# Patient Record
Sex: Female | Born: 1990 | Race: White | Hispanic: No | Marital: Single | Smoking: Never smoker
Health system: Southern US, Community
[De-identification: ages and names within clinical notes are randomized; demographics above are authoritative.]

## PROBLEM LIST (undated history)

## (undated) DIAGNOSIS — L309 Dermatitis, unspecified: Secondary | ICD-10-CM

## (undated) DIAGNOSIS — F419 Anxiety disorder, unspecified: Secondary | ICD-10-CM

## (undated) DIAGNOSIS — J309 Allergic rhinitis, unspecified: Secondary | ICD-10-CM

## (undated) DIAGNOSIS — Z91018 Allergy to other foods: Secondary | ICD-10-CM

## (undated) DIAGNOSIS — F410 Panic disorder [episodic paroxysmal anxiety] without agoraphobia: Secondary | ICD-10-CM

## (undated) DIAGNOSIS — J45909 Unspecified asthma, uncomplicated: Secondary | ICD-10-CM

## (undated) HISTORY — PX: MOUTH SURGERY: SHX715

## (undated) HISTORY — PX: WRIST ARTHROCENTESIS: SUR48

## (undated) HISTORY — PX: WRIST GANGLION EXCISION: SUR520

## (undated) HISTORY — PX: WRIST SURGERY: SHX841

## (undated) HISTORY — PX: NASAL SINUS SURGERY: SHX719

## (undated) HISTORY — DX: Allergy to other foods: Z91.018

## (undated) HISTORY — DX: Allergic rhinitis, unspecified: J30.9

---
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
## 2012-07-07 ENCOUNTER — Emergency Department (HOSPITAL_BASED_OUTPATIENT_CLINIC_OR_DEPARTMENT_OTHER): Payer: BC Managed Care – PPO

## 2012-07-07 ENCOUNTER — Emergency Department (HOSPITAL_BASED_OUTPATIENT_CLINIC_OR_DEPARTMENT_OTHER)
Admission: EM | Admit: 2012-07-07 | Discharge: 2012-07-07 | Disposition: A | Discharge status: Home / Self Care | Payer: BC Managed Care – PPO | Attending: Emergency Medicine | Admitting: Emergency Medicine

## 2012-07-07 ENCOUNTER — Encounter (HOSPITAL_BASED_OUTPATIENT_CLINIC_OR_DEPARTMENT_OTHER): Payer: Self-pay | Admitting: Emergency Medicine

## 2012-07-07 DIAGNOSIS — Z872 Personal history of diseases of the skin and subcutaneous tissue: Secondary | ICD-10-CM | POA: Insufficient documentation

## 2012-07-07 DIAGNOSIS — Z8659 Personal history of other mental and behavioral disorders: Secondary | ICD-10-CM | POA: Insufficient documentation

## 2012-07-07 DIAGNOSIS — N390 Urinary tract infection, site not specified: Secondary | ICD-10-CM | POA: Insufficient documentation

## 2012-07-07 DIAGNOSIS — R259 Unspecified abnormal involuntary movements: Secondary | ICD-10-CM | POA: Insufficient documentation

## 2012-07-07 DIAGNOSIS — R253 Fasciculation: Secondary | ICD-10-CM

## 2012-07-07 DIAGNOSIS — Z3202 Encounter for pregnancy test, result negative: Secondary | ICD-10-CM | POA: Insufficient documentation

## 2012-07-07 DIAGNOSIS — IMO0001 Reserved for inherently not codable concepts without codable children: Secondary | ICD-10-CM | POA: Insufficient documentation

## 2012-07-07 DIAGNOSIS — J45909 Unspecified asthma, uncomplicated: Secondary | ICD-10-CM | POA: Insufficient documentation

## 2012-07-07 DIAGNOSIS — Z79899 Other long term (current) drug therapy: Secondary | ICD-10-CM | POA: Insufficient documentation

## 2012-07-07 HISTORY — DX: Dermatitis, unspecified: L30.9

## 2012-07-07 HISTORY — DX: Unspecified asthma, uncomplicated: J45.909

## 2012-07-07 HISTORY — DX: Panic disorder (episodic paroxysmal anxiety): F41.0

## 2012-07-07 HISTORY — DX: Anxiety disorder, unspecified: F41.9

## 2012-07-07 LAB — CBC WITH DIFFERENTIAL/PLATELET
Basophils Absolute: 0 10*3/uL (ref 0.0–0.1)
Eosinophils Absolute: 0.3 10*3/uL (ref 0.0–0.7)
Eosinophils Relative: 4 % (ref 0–5)
HCT: 38.4 % (ref 36.0–46.0)
Lymphs Abs: 2.1 10*3/uL (ref 0.7–4.0)
MCH: 30.9 pg (ref 26.0–34.0)
MCV: 89.9 fL (ref 78.0–100.0)
Monocytes Absolute: 1.1 10*3/uL — ABNORMAL HIGH (ref 0.1–1.0)
Platelets: 250 10*3/uL (ref 150–400)
RDW: 12.7 % (ref 11.5–15.5)

## 2012-07-07 LAB — POCT I-STAT, CHEM 8
BUN: 12 mg/dL (ref 6–23)
Calcium, Ion: 1.27 mmol/L — ABNORMAL HIGH (ref 1.12–1.23)
Hemoglobin: 13.6 g/dL (ref 12.0–15.0)
Sodium: 139 mEq/L (ref 135–145)
TCO2: 26 mmol/L (ref 0–100)

## 2012-07-07 LAB — URINALYSIS, ROUTINE W REFLEX MICROSCOPIC
Hgb urine dipstick: NEGATIVE
Nitrite: NEGATIVE
Protein, ur: NEGATIVE mg/dL
Specific Gravity, Urine: 1.015 (ref 1.005–1.030)
Urobilinogen, UA: 1 mg/dL (ref 0.0–1.0)

## 2012-07-07 LAB — COMPREHENSIVE METABOLIC PANEL
ALT: 12 U/L (ref 0–35)
Calcium: 9.8 mg/dL (ref 8.4–10.5)
Creatinine, Ser: 0.7 mg/dL (ref 0.50–1.10)
GFR calc Af Amer: >90 mL/min (ref >90)
GFR calc non Af Amer: >90 mL/min (ref >90)
Glucose, Bld: 98 mg/dL (ref 70–99)
Sodium: 311 mEq/L (ref 135–145)
Total Protein: 6.8 g/dL (ref 6.0–8.3)

## 2012-07-07 LAB — URINE MICROSCOPIC-ADD ON

## 2012-07-07 LAB — PREGNANCY, URINE: Preg Test, Ur: NEGATIVE

## 2012-07-07 MED ORDER — NITROFURANTOIN MONOHYD MACRO 100 MG PO CAPS: 100.0000 mg | ORAL_CAPSULE | Freq: Two times a day (BID) | ORAL | Status: DC

## 2012-07-07 NOTE — ED Notes (Signed)
Patient transported to X-ray 

## 2012-07-07 NOTE — ED Notes (Signed)
MD at bedside. 

## 2012-07-07 NOTE — ED Notes (Signed)
Pt sts she was not able to hold her urine when she got to the toilet and voided in the toilet so fast that she could not get the cup under there in time.  Collected a very small amt of urine.  Lab stated that they could get the U-preg but not the UA.  Pt will attempt again shortly. Pt waiting now for radiology studies.

## 2012-07-07 NOTE — ED Notes (Addendum)
1130pm started having numbness in left arm.  Heaviness in back of tongue. Tingling in LLE.  Took Benadryl  (2 tblsp) immediately. Took ASA 324mg  po per EMS dispatch recommendation.

## 2012-07-07 NOTE — ED Notes (Signed)
Pt given PO liquids per request after passing stroke swallow screen. Pt speaking and swallowing without diff, NAD noted.

## 2012-07-07 NOTE — ED Provider Notes (Signed)
History     CSN: [id number]  Arrival date & time 07/07/12  0154   First MD Initiated Contact with Patient 07/07/12 [phone]      No chief complaint on file.   (Consider location/radiation/quality/duration/timing/severity/associated sxs/prior treatment) Patient is a 22 y.o. female presenting with musculoskeletal pain. The history is provided by the patient. No language interpreter was used.  Muscle Pain This is a new problem. The current episode started 3 to 5 hours ago. The problem occurs constantly. The problem has been gradually worsening. Pertinent negatives include no chest pain, no abdominal pain, no headaches and no shortness of breath. Nothing aggravates the symptoms. Nothing relieves the symptoms. She has tried nothing for the symptoms. The treatment provided no relief.  Twitching in the left thigh with pain in the thigh and LUE.  No weakness. Tongue felt funny and thought it was a food allergy of which she has many and took benadryl.  No rashes.  No changes in vision nor speech no weakness.  No trauma.  No HA.  No tick exposure.  No travel exposure.  No rashes no the skin.   No f/c/r.  No neck pain nor stiffness.  No swelling of the lips or tongue no sweats.  No CP nor SOB  Past Medical History  Diagnosis Date  . Asthma   . Panic attack   . Anxiety   . Eczema     Past Surgical History  Procedure Laterality Date  . Wrist surgery    . Mouth surgery    . Nasal sinus surgery      No family history on file.  History  Substance Use Topics  . Smoking status: Never Smoker   . Smokeless tobacco: Not on file  . Alcohol Use: Yes    OB History   Grav Para Term Preterm Abortions TAB SAB Ect Mult Living                  Review of Systems  Constitutional: Negative for fever.  HENT: Negative for facial swelling, trouble swallowing, neck pain and neck stiffness.   Eyes: Negative for visual disturbance.  Respiratory: Negative for shortness of breath.   Cardiovascular: Negative  for chest pain.  Gastrointestinal: Negative for vomiting and abdominal pain.  Musculoskeletal: Negative for back pain and gait problem.  Skin: Negative for rash.  Allergic/Immunologic: Positive for food allergies.  Neurological: Negative for dizziness, seizures, facial asymmetry, speech difficulty, weakness, light-headedness and headaches.  All other systems reviewed and are negative.    Allergies  Review of patient's allergies indicates no known allergies.  Home Medications   Current Outpatient Rx  Name  Route  Sig  Dispense  Refill  . albuterol (PROVENTIL HFA;VENTOLIN HFA) 108 (90 BASE) MCG/ACT inhaler   Inhalation   Inhale 2 puffs into the lungs every 6 (six) hours as needed for wheezing.           BP 126/79  Pulse 98  Temp(Src) 97.7 F (36.5 C) (Oral)  Ht 5' 3.25" (1.607 m)  Wt 147 lb (66.679 kg)  BMI 25.82 kg/m2  SpO2 99%  LMP 06/25/2012  Physical Exam  Constitutional: She is oriented to person, place, and time. She appears well-developed and well-nourished. No distress.  HENT:  Head: Normocephalic and atraumatic.  Right Ear: Tympanic membrane is not injected.  Left Ear: Tympanic membrane is not injected.  Mouth/Throat: Oropharynx is clear and moist.  Eyes: Conjunctivae are normal. Pupils are equal, round, and reactive to light.  Neck:  Normal range of motion. Neck supple. No thyromegaly present.  Cardiovascular: Normal rate, regular rhythm and intact distal pulses.   Pulmonary/Chest: Effort normal and breath sounds normal. No stridor. She has no wheezes. She has no rales.  Abdominal: Soft. Bowel sounds are normal. There is no tenderness. There is no rebound and no guarding.  Musculoskeletal: Normal range of motion. She exhibits no edema.  fasiculations of the left quadriceps muscle  Lymphadenopathy:    She has no cervical adenopathy.  Neurological: She is alert and oriented to person, place, and time. She has normal reflexes. She displays normal reflexes. No  cranial nerve deficit. Coordination normal.  Skin: Skin is warm and dry.  Psychiatric: She has a normal mood and affect.    ED Course  Procedures (including critical care time)  Labs Reviewed  CBC WITH DIFFERENTIAL - Abnormal; Notable for the following:    Monocytes Relative 15 (*)    Monocytes Absolute 1.1 (*)    All other components within normal limits  URINALYSIS, ROUTINE W REFLEX MICROSCOPIC - Abnormal; Notable for the following:    APPearance TURBID (*)    Leukocytes, UA MODERATE (*)    All other components within normal limits  URINE MICROSCOPIC-ADD ON - Abnormal; Notable for the following:    Squamous Epithelial / LPF FEW (*)    Bacteria, UA FEW (*)    All other components within normal limits  URINE CULTURE  PREGNANCY, URINE  COMPREHENSIVE METABOLIC PANEL  MAGNESIUM   Dg Chest 2 View  07/07/2012   *RADIOLOGY REPORT*  Clinical Data: Numbness in left arm.  CHEST - 2 VIEW  Comparison: None  Findings: The cardiac silhouette, mediastinal and hilar contours are normal.  The lungs are clear.  No pleural effusion.  The bony thorax is intact.  IMPRESSION: Normal chest x-ray.   Original Report Authenticated By: Rudie Meyer, M.D.   Dg Lumbar Spine Complete  07/07/2012   *RADIOLOGY REPORT*  Clinical Data: Back pain.  LUMBAR SPINE - COMPLETE 4+ VIEW  Comparison: None  Findings: The lateral film demonstrates normal alignment. Vertebral bodies and disc spaces are maintained.  No acute bony findings.  Normal alignment of the facet joints and no pars defects.  The visualized bony pelvis in intact.  IMPRESSION: Normal alignment and no acute bony findings or degenerative changes.   Original Report Authenticated By: Rudie Meyer, M.D.   Ct Head Wo Contrast  07/07/2012   *RADIOLOGY REPORT*  Clinical Data: Numbness in left arm.  CT HEAD WITHOUT CONTRAST  Technique:  Contiguous axial images were obtained from the base of the skull through the vertex without contrast.  Comparison: None  Findings:  The ventricles are normal.  No extra-axial fluid collections are seen.  The brainstem and cerebellum are unremarkable.  No acute intracranial findings such as infarction or hemorrhage.  No mass lesions.  The bony calvarium is intact.  The visualized paranasal sinuses and mastoid air cells are clear.  IMPRESSION: No acute intracranial findings or mass lesions.   Original Report Authenticated By: Rudie Meyer, M.D.     No diagnosis found.    MDM  Fasciculations and paresthesias:  Will refer to neurology for ongoing care.  Will treat UTI with antibiotics.  Return for worsening symptoms.  Patient verbalizes understanding and agrees to follow up        Cristoval Teall Smitty Cords, MD 07/07/12 [phone]

## 2012-07-08 LAB — URINE CULTURE: Colony Count: >=100000

## 2013-06-17 HISTORY — PX: HIP ARTHROSCOPY: SHX668

## 2014-07-03 IMAGING — CT CT HEAD W/O CM
1 series · 16 of 30 positions shown, 20 images · non-contrast
Comparison: None

CLINICAL DATA: Numbness in left arm.

CT HEAD WITHOUT CONTRAST
TECHNIQUE: Contiguous axial images were obtained from the base of
the skull through the vertex without contrast.

[Series 2: head 4.8 h37s · axial · 0.42mm/px · z∈[-185,-52]mm · 16 of 32 slices shown, 20 images]
[im 2/32  brain]
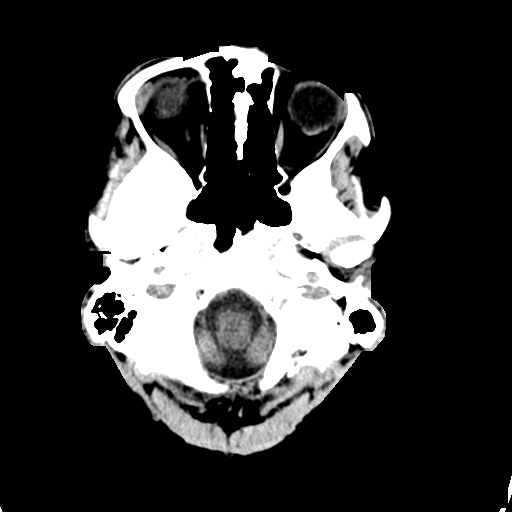
[im 2/32  bone]
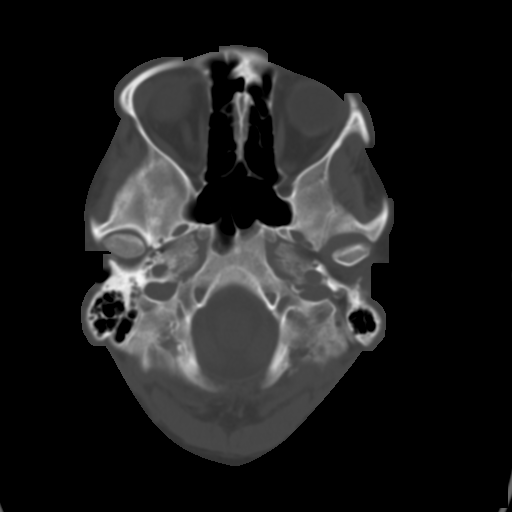
[im 4/32  brain]
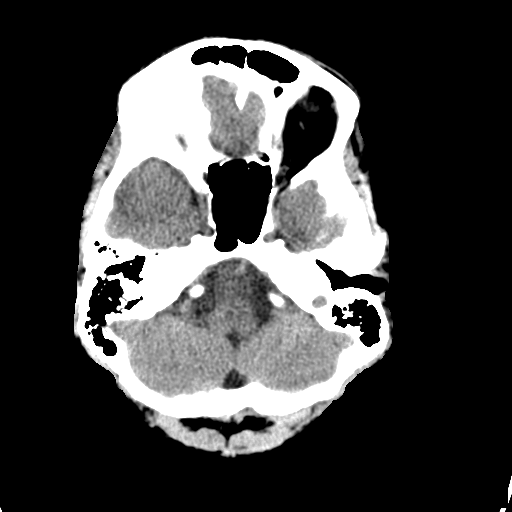
[im 6/32  brain]
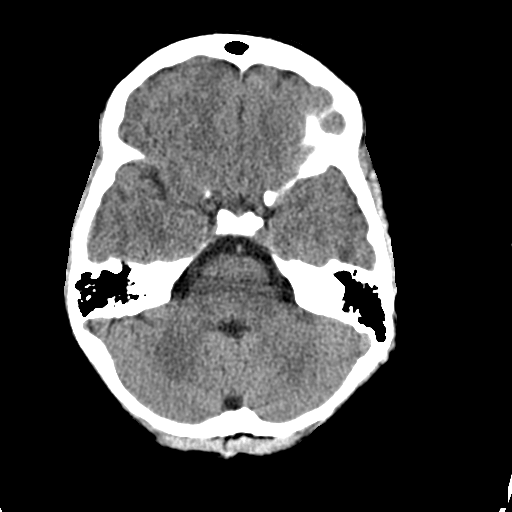
[im 8/32  brain]
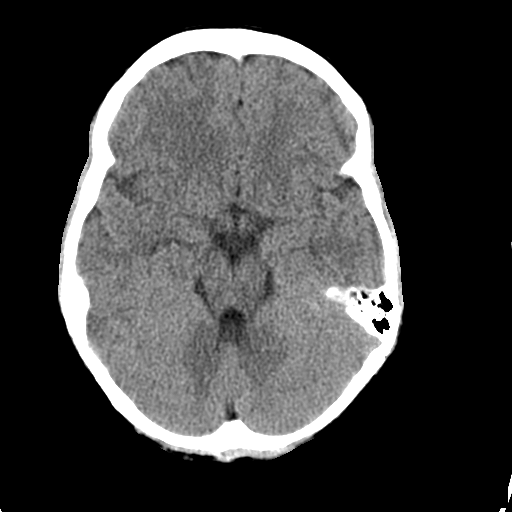
[im 9/32  brain]
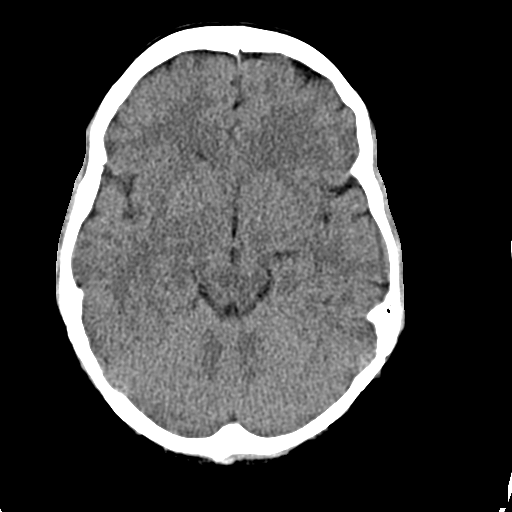
[im 9/32  bone]
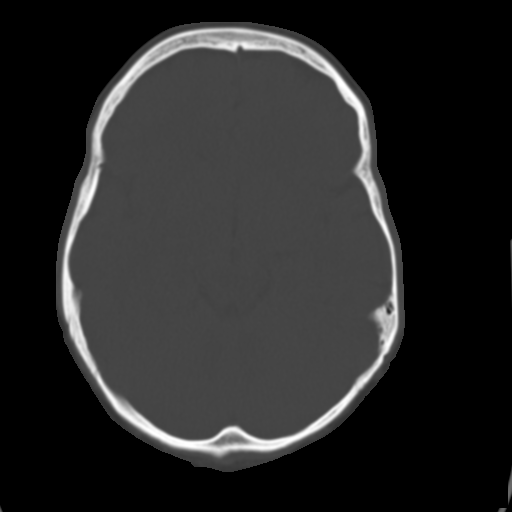
[im 11/32  brain]
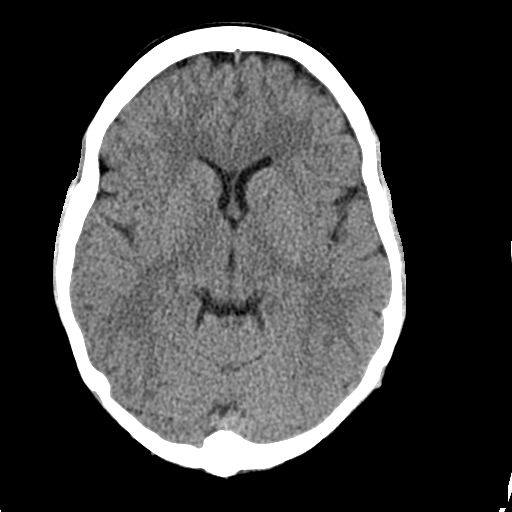
[im 13/32  brain]
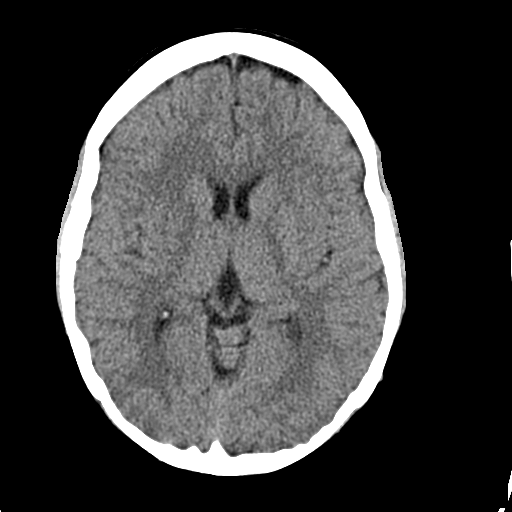
[im 15/32  brain]
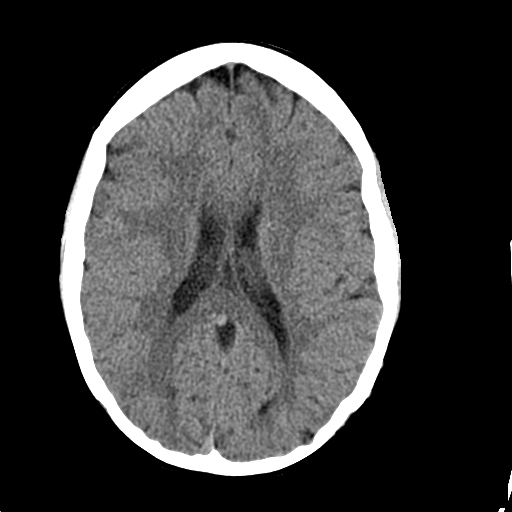
[im 17/32  brain]
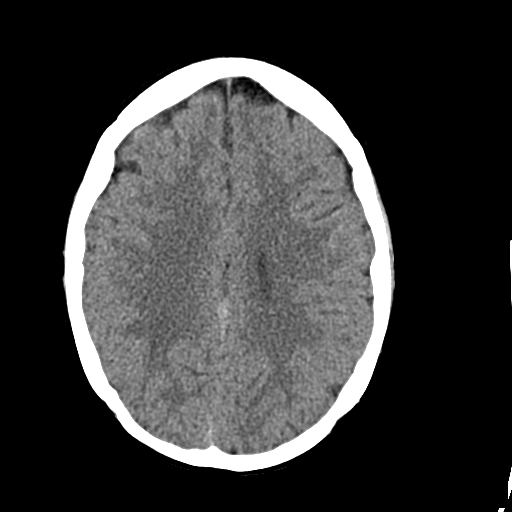
[im 17/32  bone]
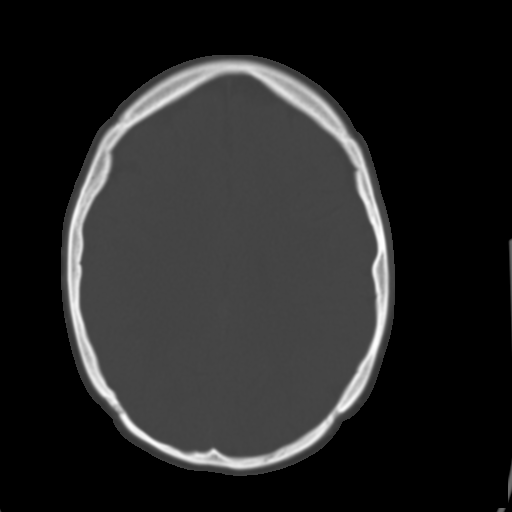
[im 19/32  brain]
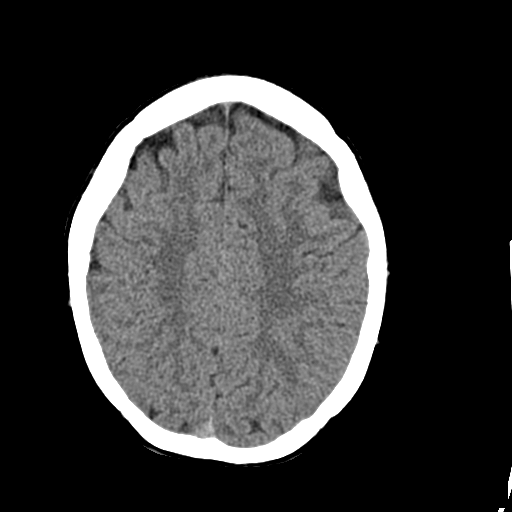
[im 21/32  brain]
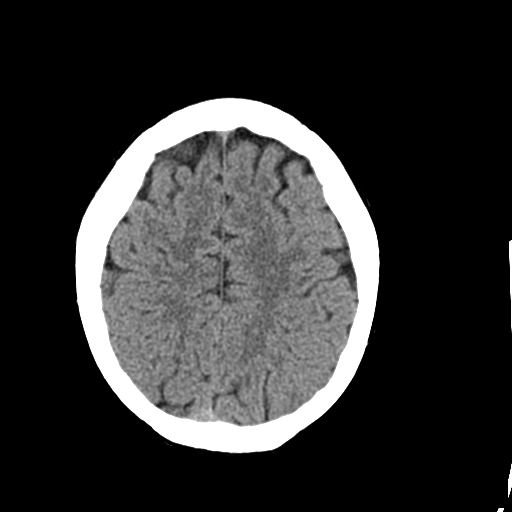
[im 23/32  brain]
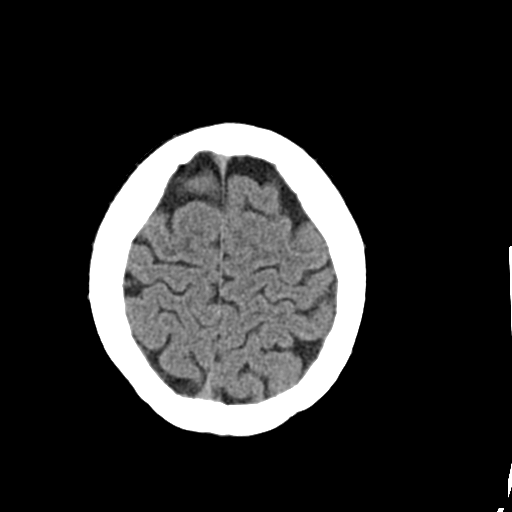
[im 24/32  brain]
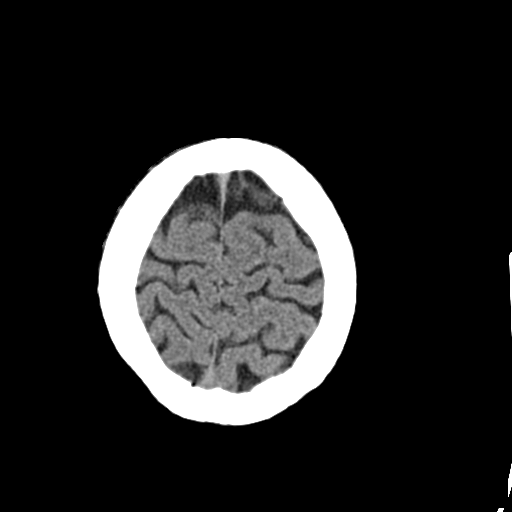
[im 24/32  bone]
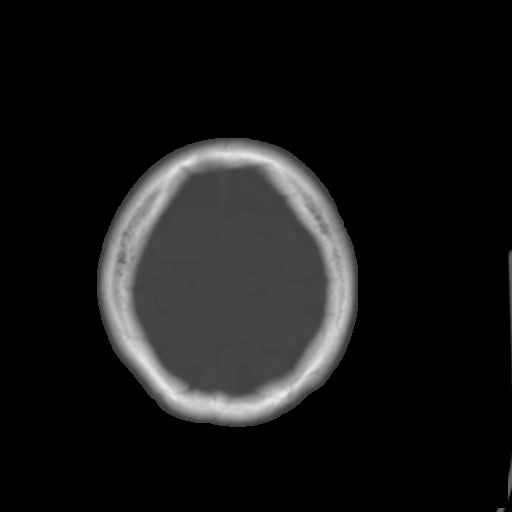
[im 26/32  brain]
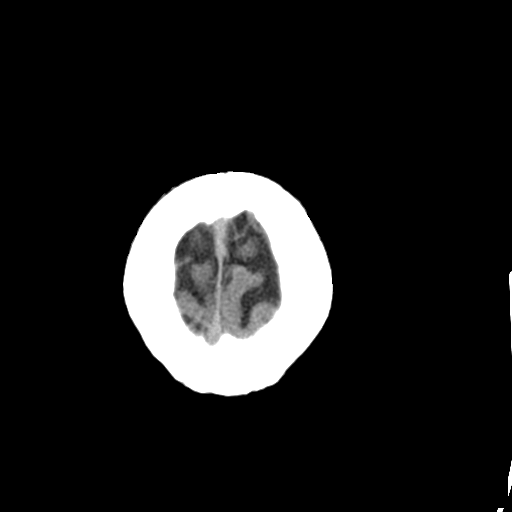
[im 28/32  brain]
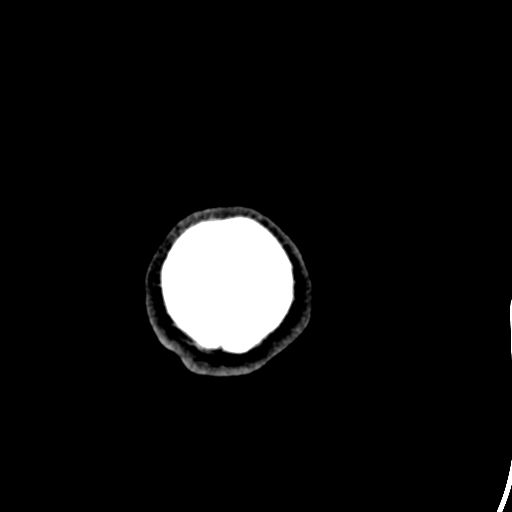
[im 30/32  brain]
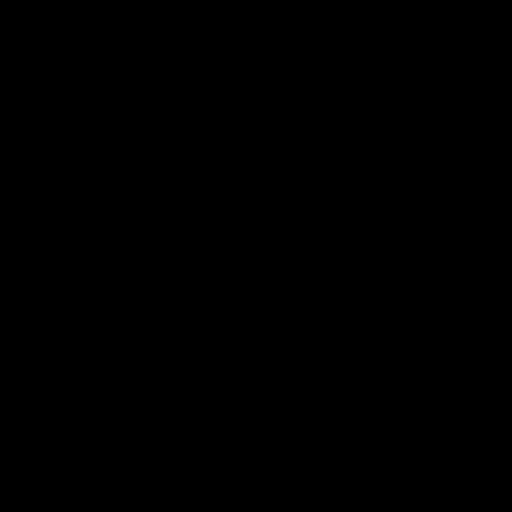

[16 of 30 positions shown; findings below may reference images not displayed]

FINDINGS: The ventricles are normal.  No extra-axial fluid
collections are seen.  The brainstem and cerebellum are
unremarkable.  No acute intracranial findings such as infarction or
hemorrhage.  No mass lesions.

The bony calvarium is intact.  The visualized paranasal sinuses and
mastoid air cells are clear.
IMPRESSION: No acute intracranial findings or mass lesions.

## 2015-01-10 ENCOUNTER — Encounter: Payer: Self-pay | Admitting: Allergy and Immunology

## 2015-01-10 ENCOUNTER — Ambulatory Visit (INDEPENDENT_AMBULATORY_CARE_PROVIDER_SITE_OTHER): Payer: BLUE CROSS/BLUE SHIELD | Admitting: Allergy and Immunology

## 2015-01-10 VITALS — BP 112/68 | HR 88 | Temp 98.3°F | Resp 16 | Ht 63.39 in | Wt 161.8 lb

## 2015-01-10 DIAGNOSIS — J4520 Mild intermittent asthma, uncomplicated: Secondary | ICD-10-CM | POA: Diagnosis not present

## 2015-01-10 DIAGNOSIS — T7800XD Anaphylactic reaction due to unspecified food, subsequent encounter: Secondary | ICD-10-CM | POA: Diagnosis not present

## 2015-01-10 DIAGNOSIS — T7819XA Other adverse food reactions, not elsewhere classified, initial encounter: Secondary | ICD-10-CM | POA: Insufficient documentation

## 2015-01-10 DIAGNOSIS — T782XXD Anaphylactic shock, unspecified, subsequent encounter: Secondary | ICD-10-CM

## 2015-01-10 DIAGNOSIS — J3089 Other allergic rhinitis: Secondary | ICD-10-CM | POA: Insufficient documentation

## 2015-01-10 DIAGNOSIS — T63481D Toxic effect of venom of other arthropod, accidental (unintentional), subsequent encounter: Secondary | ICD-10-CM | POA: Diagnosis not present

## 2015-01-10 DIAGNOSIS — T781XXD Other adverse food reactions, not elsewhere classified, subsequent encounter: Secondary | ICD-10-CM

## 2015-01-10 DIAGNOSIS — T782XXA Anaphylactic shock, unspecified, initial encounter: Secondary | ICD-10-CM

## 2015-01-10 DIAGNOSIS — T63481A Toxic effect of venom of other arthropod, accidental (unintentional), initial encounter: Secondary | ICD-10-CM | POA: Insufficient documentation

## 2015-01-10 DIAGNOSIS — T781XXA Other adverse food reactions, not elsewhere classified, initial encounter: Secondary | ICD-10-CM | POA: Insufficient documentation

## 2015-01-10 DIAGNOSIS — T7800XA Anaphylactic reaction due to unspecified food, initial encounter: Secondary | ICD-10-CM | POA: Insufficient documentation

## 2015-01-10 MED ORDER — EPINEPHRINE 0.3 MG/0.3ML IJ SOAJ: 0.3000 mg | INTRAMUSCULAR | Status: AC | PRN

## 2015-01-10 MED ORDER — ALBUTEROL SULFATE HFA 108 (90 BASE) MCG/ACT IN AERS: 2.0000 | INHALATION_SPRAY | RESPIRATORY_TRACT | Status: AC | PRN

## 2015-01-10 NOTE — Assessment & Plan Note (Signed)
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 Peeling or cooking the food has shown to reduce symptoms and antihistamines may also relieve symptoms. Immunotherapy to the cross reacting pollens has improved or cured OAS in many patients, though this has not been consistent for all patients. Typically OAS is limited to itching or swelling of mucosal tissues from the lips to the back of the throat.   Information about OAS has been discussed and provided in written form.  All foods causing symptoms are to be avoided.  Should symptoms progress beyond the mouth and throat, 911 is to be called immediately. 

## 2015-01-10 NOTE — Assessment & Plan Note (Signed)
   Aeroallergen avoidance measures have been discussed and provided in written form.  Continue cetirizine 10 mg daily as needed and Veramyst 2 sprays per nostril daily as needed.  If allergen avoidance measures and medications fail to adequately relieve symptoms, we will consider immunotherapy.

## 2015-01-10 NOTE — Progress Notes (Signed)
History of present illness: HPI Comments: Evelyn Morton is a 24 y.o. female with a history of food allergies, oral allergy syndrome, allergic rhinitis, and asthma presents today for follow up.  She was last seen in this office in June 2014.  She reports that over the past 2 or 3 years she has experienced which she believes to be "on and off reactions" to certain foods.  In spring of 2016 she consumed Crown apple liquor and developed hives "all over".  She went to the emergency department for evaluation and treatment.  She does not recall the specific treatment she received.  On one occasion, she consumed pomegranate seeds and she perceived the sensation of chest tightness and went to the ER for treatment.  She reports that she ate avocado and soon after vomited and passed out.  She has developed tongue edema with the consumption of peanuts and systemic reactions after eating tree nuts.  One month ago she consumed snow peas and humus and experiences sensation of itchy throat, itchy ears, and lightheadedness.  She took an over-the-counter antihistamine and her symptoms resolved.  She experiences oral pharyngeal pruritus when consuming raw apples, kiwi, or strawberries.  She is able to tolerate these foods without symptoms if they are cooked or processed. She experiences severe nasal congestion, rhinorrhea, sneezing, and ocular pruritus.  The symptoms are most severe in the spring but also occur frequently in the fall.  Her asthma symptoms are most frequent during the springtime.  Her in the spring she typically requires albuterol rescue one time per week.  She takes montelukast during spring but does not take it during other times the year.  Over the past 1-2 months she has not required rescue medication, experienced nocturnal awakenings due to lower respiratory symptoms, nor have activities of daily living been limited.  She has hymenoptera venom hypersensitivity.  She was on venom immunotherapy but discontinued  this therapy due to challenges with her schedule.  Her EpiPen has expired.   Assessment and plan: No problem-specific assessment & plan notes found for this encounter.   Medications ordered this encounter: No orders of the defined types were placed in this encounter.    Diagnositics: Spirometry:  Normal with an FEV1 of 3.2 L (105% predicted).  Please see scanned spirometry results for details. Food allergen skin testing: Positive to peanut, soybean, corn, orange, green pea, carrot, celery, cottonseed, almond, hazelnut, [place] nut, mustard seed, sesame seed, and borderline positive to sweet potato, watermelon, and coconut.     Physical examination: Blood pressure 112/68, pulse 88, temperature 98.3 F (36.8 C), temperature source Oral, resp. rate 16, height 5' 3.39" (1.61 m), weight 161 lb 13.1 oz (73.4 kg).  General: Alert, interactive, in no acute distress. HEENT: TMs pearly gray, turbinates moderately edematous with crusty discharge, post-pharynx moderately erythematous. Neck: Supple without lymphadenopathy. Lungs: Clear to auscultation without wheezing, rhonchi or rales. CV: Normal S1, S2 without murmurs. Skin: Warm and dry, without lesions or rashes.  The following portions of the patient's history were reviewed and updated as appropriate: allergies, current medications, past family history, past medical history, past social history, past surgical history and problem list.  Outpatient medications:   Medication List       This list is accurate as of: 01/10/15  9:47 AM.  Always use your most recent med list.               albuterol 108 (90 BASE) MCG/ACT inhaler  Commonly known as:  PROVENTIL HFA;VENTOLIN HFA  Inhale 2 puffs into the lungs every 6 (six) hours as needed for wheezing.     cetirizine 10 MG tablet  Commonly known as:  ZYRTEC  Take 10 mg by mouth daily. Takes it in the spring     EPIPEN 2-PAK 0.3 mg/0.3 mL Soaj injection  Generic drug:  EPINEPHrine   Inject 0.3 mg into the muscle once.     fluticasone 27.5 MCG/SPRAY nasal spray  Commonly known as:  VERAMYST  Place 2 sprays into the nose daily. Patient doesn't like it.     montelukast 10 MG tablet  Commonly known as:  SINGULAIR  Take 10 mg by mouth as needed. Patient does in the spring     RA NAPROXEN SODIUM 220 MG tablet  Generic drug:  naproxen sodium  Take 220 mg by mouth.     SKYLA 13.5 MG Iud  Generic drug:  Levonorgestrel  by Intrauterine route.     VISINE 0.05 % ophthalmic solution  Generic drug:  tetrahydrozoline        Known medication allergies: Allergies  Allergen Reactions  . Bee Venom Anaphylaxis  . Apple Hives    Other Reaction: Other reaction Other reaction(s): Other (See Comments) Other Reaction: Other reaction  . Citrullus Vulgaris Other (See Comments)    Other Reaction: Other reaction  . Daucus Carota     Other reaction(s): Other (See Comments) Other Reaction: Other reaction  . Other Itching and Other (See Comments)    Raw fruit/ seasonal allergies/ nuts and vegetables Itching throat and resp. problems Uncoded Allergy. Allergen: Peach and Pear Skins, Other Reaction: Other reaction Uncoded Allergy. Allergen: CANTELOPE, Other Reaction: Other reaction  . Peanut Oil     All nuts   Review of systems: Constitutional: Negative for fever, chills and weight loss.  HENT: Negative for nosebleeds.   Eyes: Negative for blurred vision.  Respiratory: Negative for hemoptysis.   Cardiovascular: Negative for chest pain.  Gastrointestinal: Negative for diarrhea and constipation.  Genitourinary: Negative for dysuria.  Musculoskeletal: Negative for myalgias and joint pain.  Neurological: Negative for dizziness.  Endo/Heme/Allergies: Does not bruise/bleed easily.  Positive for food allergies.  Positive for hymenoptera venom allergies.  Past Medical History  Diagnosis Date  . Panic attack   . Anxiety   . Eczema   . Multiple food allergies   . Allergic  rhinitis   . Asthma     Family History  Problem Relation Age of Onset  . Allergic rhinitis Mother   . Asthma Mother   . Angioedema Neg Hx   . Eczema Neg Hx   . Immunodeficiency Neg Hx   . Urticaria Neg Hx     Social History   Social History  . Marital Status: Single    Spouse Name: N/A  . Number of Children: N/A  . Years of Education: N/A   Occupational History  . Not on file.   Social History Main Topics  . Smoking status: Never Smoker   . Smokeless tobacco: Not on file  . Alcohol Use: 3.0 oz/week    5 Glasses of wine per week  . Drug Use: No  . Sexual Activity: Yes    Birth Control/ Protection: Pill   Other Topics Concern  . Not on file   Social History Narrative    I appreciate the opportunity to take part in this Tamu's care. Please do not hesitate to contact me with questions.  Sincerely,   R. Jorene Guest, MD

## 2015-01-10 NOTE — Patient Instructions (Addendum)
Allergy with anaphylaxis due to food  Meticulous avoidance of culprit foods as discussed.  We were unable to skin test to avacado and pomegranate, therefore until allergy can be definitively ruled out, she is to avoid these foods as well.  A refill prescription has been provided for epinephrine auto-injector 2 pack along with instructions for proper administration.  A food allergy action plan has been provided and discussed.  Medic Alert identification is recommended.  Should symptoms recur without consuming these foods, a journal is to be kept recording any foods eaten, beverages consumed, and medications taken within a 6 hour time period prior to the onset of symptoms, as well as record activities being performed, and environmental conditions. For any symptoms concerning for anaphylaxis, epinephrine is to be administered and 911 is to be called immediately.   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 Peeling or cooking the food has shown to reduce symptoms and antihistamines may also relieve symptoms. Immunotherapy to the cross reacting pollens has improved or cured OAS in many patients, though this has not been consistent for all patients. Typically OAS is limited to itching or swelling of mucosal tissues from the lips to the back of the throat.   Information about OAS has been discussed and provided in written form.  All foods causing symptoms are to be avoided.  Should symptoms progress beyond the mouth and throat, 911 is to be called immediately.  Food allergen skin testing: Positive to peanut, soybean, corn, orange, green pea, carrot, celery, cottonseed, almond, hazelnut, [place] nut, mustard seed, sesame seed, and borderline positive to sweet potato, watermelon, and coconut.  Allergic rhinoconjunctivitis  Aeroallergen avoidance measures have been discussed and provided in written form.  Continue cetirizine 10  mg daily as needed and Veramyst 2 sprays per nostril daily as needed.  If allergen avoidance measures and medications fail to adequately relieve symptoms, we will consider immunotherapy.  Mild intermittent asthma  Continue albuterol HFA, 1-2 inhalations every 4-6 hours as needed.    During the springtime she may restart montelukast 10 mg daily at bedtime.  Subjective and objective measures of pulmonary function will be followed and the treatment plan will be adjusted accordingly.  Anaphylaxis due to hymenoptera venom  Continue insect avoidance and have access to epinephrine autoinjector 2 pack.  An EpiPen refill prescription has been provided.  If Adrian's schedule allows, consider restarting venom immunotherapy.   Return in about 6 months (around 07/10/2015), or if symptoms worsen or fail to improve.  Reducing Pollen Exposure  The American Academy of Allergy, Asthma and Immunology suggests the following steps to reduce your exposure to pollen during allergy seasons.    1. Do not hang sheets or clothing out to dry; pollen may collect on these items. 2. Do not mow lawns or spend time around freshly cut grass; mowing stirs up pollen. 3. Keep windows closed at night.  Keep car windows closed while driving. 4. Minimize morning activities outdoors, a time when pollen counts are usually at their highest. 5. Stay indoors as much as possible when pollen counts or humidity is high and on windy days when pollen tends to remain in the air longer. 6. Use air conditioning when possible.  Many air conditioners have filters that trap the pollen spores. 7. Use a HEPA room air filter to remove pollen form the indoor air you breathe.   Control of House Dust Mite Allergen  House dust mites play  a major role in allergic asthma and rhinitis.  They occur in environments with high humidity wherever human skin, the food for dust mites is found. High levels have been detected in dust obtained from  mattresses, pillows, carpets, upholstered furniture, bed covers, clothes and soft toys.  The principal allergen of the house dust mite is found in its feces.  A gram of dust may contain 1,000 mites and 250,000 fecal particles.  Mite antigen is easily measured in the air during house cleaning activities.    1. Encase mattresses, including the box spring, and pillow, in an air tight cover.  Seal the zipper end of the encased mattresses with wide adhesive tape. 2. Wash the bedding in water of 130 degrees Farenheit weekly.  Avoid cotton comforters/quilts and flannel bedding: the most ideal bed covering is the dacron comforter. 3. Remove all upholstered furniture from the bedroom. 4. Remove carpets, carpet padding, rugs, and non-washable window drapes from the bedroom.  Wash drapes weekly or use plastic window coverings. 5. Remove all non-washable stuffed toys from the bedroom.  Wash stuffed toys weekly. 6. Have the room cleaned frequently with a vacuum cleaner and a damp dust-mop.  The patient should not be in a room which is being cleaned and should wait 1 hour after cleaning before going into the room. 7. Close and seal all heating outlets in the bedroom.  Otherwise, the room will become filled with dust-laden air.  An electric heater can be used to heat the room. 8. Reduce indoor humidity to less than 50%.  Do not use a humidifier.

## 2015-01-10 NOTE — Assessment & Plan Note (Signed)
   Continue albuterol HFA, 1-2 inhalations every 4-6 hours as needed.    During the springtime she may restart montelukast 10 mg daily at bedtime.  Subjective and objective measures of pulmonary function will be followed and the treatment plan will be adjusted accordingly.

## 2015-01-10 NOTE — Assessment & Plan Note (Signed)
   Continue insect avoidance and have access to epinephrine autoinjector 2 pack.  An EpiPen refill prescription has been provided.  If Shadi's schedule allows, consider restarting venom immunotherapy.

## 2015-01-10 NOTE — Progress Notes (Deleted)
History of present illness: HPI Comments:     Assessment and plan: No problem-specific assessment & plan notes found for this encounter.   Medications ordered this encounter: No orders of the defined types were placed in this encounter.    Diagnositics: Spirometry:  Normal with an FEV1 of 3.2 L (105% predicted).  Please see scanned spirometry results for details. Food allergen skin testing: ***    Physical examination: Blood pressure 112/68, pulse 88, temperature 98.3 F (36.8 C), temperature source Oral, resp. rate 16, height 5' 3.39" (1.61 m), weight 161 lb 13.1 oz (73.4 kg).  General: Alert, interactive, in no acute distress. HEENT: TMs pearly gray, turbinates {Blank single:19197::"non","markedly","moderately","mildly","minimally"} edematous {Blank single:19197::"with crusty discharge","with thick discharge","with clear discharge","without discharge"}, post-pharynx {Blank single:19197::"unremarkable","non erythematous","erythematous","markedly erythematous","moderately erythematous","mildly erythematous"}. Neck: Supple without lymphadenopathy. Lungs: {Blank single:19197::"Decreased breath sounds with expiratory wheezing bilaterally","Mildly decreased breath sounds with expiratory wheezing bilaterally","Decreased breath sounds bilaterally without wheezing, rhonchi or rales","Mildly decreased breath sounds bilaterally without wheezing, rhonchi or rales","Clear to auscultation without wheezing, rhonchi or rales"}. CV: Normal S1, S2 without murmurs. Abdomen: Nondistended, nontender. Skin: {Blank single:19197::"Dry, erythematous, excoriated patches on the ***","Dry, hyperpigmented, thickened patches on the ***","Dry, mildly hyperpigmented, mildly thickened patches on the ***","Scattered erythematous urticarial type lesions primarily located *** , nonvesicular","Warm and dry, without lesions or rashes"}. Extremities:  No clubbing, cyanosis or edema. Neuro:   Grossly intact.  Review of  systems: ROS  Past medical history: Past Medical History  Diagnosis Date  . Asthma   . Panic attack   . Anxiety   . Eczema     Past surgical history: Past Surgical History  Procedure Laterality Date  . Wrist surgery    . Mouth surgery    . Nasal sinus surgery      Family history: No family history on file.  Social history: Social History   Social History  . Marital Status: Single    Spouse Name: N/A  . Number of Children: N/A  . Years of Education: N/A   Occupational History  . Not on file.   Social History Main Topics  . Smoking status: Never Smoker   . Smokeless tobacco: Not on file  . Alcohol Use: 3.0 oz/week    5 Glasses of wine per week  . Drug Use: No  . Sexual Activity: Yes    Birth Control/ Protection: Pill   Other Topics Concern  . Not on file   Social History Narrative   Environmental History:  Evelyn Morton lives in an apartment with carpeting throughout and central air/heat.  She is a nonsmoker without pets.  Known medication allergies: Allergies  Allergen Reactions  . Bee Venom Anaphylaxis  . Apple Hives    Other Reaction: Other reaction Other reaction(s): Other (See Comments) Other Reaction: Other reaction  . Citrullus Vulgaris Other (See Comments)    Other Reaction: Other reaction  . Daucus Carota     Other reaction(s): Other (See Comments) Other Reaction: Other reaction  . Other Itching and Other (See Comments)    Raw fruit/ seasonal allergies/ nuts and vegetables Itching throat and resp. problems Uncoded Allergy. Allergen: Peach and Pear Skins, Other Reaction: Other reaction Uncoded Allergy. Allergen: CANTELOPE, Other Reaction: Other reaction  . Peanut Oil     All nuts    Outpatient medications:   Medication List       This list is accurate as of: 01/10/15  8:59 AM.  Always use your most recent med list.  albuterol 108 (90 BASE) MCG/ACT inhaler  Commonly known as:  PROVENTIL HFA;VENTOLIN HFA  Inhale 2 puffs  into the lungs every 6 (six) hours as needed for wheezing.     cetirizine 10 MG tablet  Commonly known as:  ZYRTEC  Take 10 mg by mouth daily.     EPIPEN 2-PAK 0.3 mg/0.3 mL Soaj injection  Generic drug:  EPINEPHrine  Inject 0.3 mg into the muscle once.     fluticasone 27.5 MCG/SPRAY nasal spray  Commonly known as:  VERAMYST  Place 2 sprays into the nose daily.     montelukast 10 MG tablet  Commonly known as:  SINGULAIR  Take 10 mg by mouth as needed.     nitrofurantoin (macrocrystal-monohydrate) 100 MG capsule  Commonly known as:  MACROBID  Take 1 capsule (100 mg total) by mouth 2 (two) times daily. X 7 days     RA NAPROXEN SODIUM 220 MG tablet  Generic drug:  naproxen sodium  Take 220 mg by mouth.     VISINE 0.05 % ophthalmic solution  Generic drug:  tetrahydrozoline        I appreciate the opportunity to take part in this Evelyn Morton's care. Please do not hesitate to contact me with questions.  Sincerely,   R. Jorene Guest, MD

## 2015-01-10 NOTE — Assessment & Plan Note (Addendum)
   Meticulous avoidance of culprit foods as discussed.  We were unable to skin test to snow pea and pomegranate, therefore until allergy can be definitively ruled out, she is to avoid these foods as well.  A refill prescription has been provided for epinephrine auto-injector 2 pack along with instructions for proper administration.  A food allergy action plan has been provided and discussed.  Medic Alert identification is recommended.  Should symptoms recur without consuming these foods, a journal is to be kept recording any foods eaten, beverages consumed, and medications taken within a 6 hour time period prior to the onset of symptoms, as well as record activities being performed, and environmental conditions. For any symptoms concerning for anaphylaxis, epinephrine is to be administered and 911 is to be called immediately.
